# Patient Record
Sex: Female | Born: 1995 | Race: White | Hispanic: No | Marital: Single | Smoking: Never smoker
Health system: Southern US, Community
[De-identification: ages and names within clinical notes are randomized; demographics above are authoritative.]

## PROBLEM LIST (undated history)

## (undated) DIAGNOSIS — L709 Acne, unspecified: Secondary | ICD-10-CM

## (undated) HISTORY — DX: Acne, unspecified: L70.9

---
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
## 2017-12-29 ENCOUNTER — Ambulatory Visit (INDEPENDENT_AMBULATORY_CARE_PROVIDER_SITE_OTHER): Admitting: Obstetrics and Gynecology

## 2017-12-29 ENCOUNTER — Encounter: Payer: Self-pay | Admitting: Obstetrics and Gynecology

## 2017-12-29 VITALS — BP 110/70 | HR 70 | Ht 66.0 in | Wt 148.0 lb

## 2017-12-29 DIAGNOSIS — Z30011 Encounter for initial prescription of contraceptive pills: Secondary | ICD-10-CM | POA: Diagnosis not present

## 2017-12-29 MED ORDER — NORGESTIMATE-ETH ESTRADIOL 0.25-35 MG-MCG PO TABS: 1.0000 | ORAL_TABLET | Freq: Every day | ORAL | 3 refills | Status: DC

## 2017-12-29 MED ORDER — NORGESTIMATE-ETH ESTRADIOL 0.25-35 MG-MCG PO TABS: 1.0000 | ORAL_TABLET | Freq: Every day | ORAL | 0 refills | Status: DC

## 2017-12-29 NOTE — Patient Instructions (Signed)
I value your feedback and entrusting us with your care. If you get a [facility] patient survey, I would appreciate you taking the time to let us know about your experience today. Thank you! 

## 2017-12-29 NOTE — Progress Notes (Addendum)
Arlyss QueenSelvidge, William M, MD   Chief Complaint  Patient presents with  . Contraception  . Gynecologic Exam    HPI:      Ms. Sandra Townsend is a 22 y.o. No obstetric history on file. who LMP was Patient's last menstrual period was 12/20/2017., [facility]. Pt placed on OCPs for acne and back acne 12/17 with Tammy Brothers. Pt's derm MD was managing Rx but now wants us to manage. She was on OTC with acne improvement but started having BTB, so was changed to junel 1/20 12/17. Pt states bleeding improved but acne not controlled. Stopped it about 6 months ago. Wants to restart OCPs. No issues with HTN, DVTs.  Menses are monthly, last 5 days, no BTB, mild dysmen, improved with NSAIDs.   She has never been sex active.  No tob, alcohol, drug use.  No gardasil series.  Not getting enough calcium.  Mod exercise.   Past Medical History:  Diagnosis Date  . Acne     History reviewed. No pertinent surgical history.  Family History  Problem Relation Age of Onset  . Diabetes Father   . Uterine cancer Maternal Grandmother [id number]    Social History   Socioeconomic History  . Marital status: Single    Spouse name: Not on file  . Number of children: Not on file  . Years of education: Not on file  . Highest education level: Not on file  Social Needs  . Financial resource strain: Not on file  . Food insecurity - worry: Not on file  . Food insecurity - inability: Not on file  . Transportation needs - medical: Not on file  . Transportation needs - non-medical: Not on file  Occupational History  . Not on file  Tobacco Use  . Smoking status: Never Smoker  . Smokeless tobacco: Never Used  Substance and Sexual Activity  . Alcohol use: No    Frequency: Never  . Drug use: No  . Sexual activity: No    Birth control/protection: None  Other Topics Concern  . Not on file  Social History Narrative  . Not on file    No current outpatient medications on file prior to  visit.   No current facility-administered medications on file prior to visit.      ROS:  Review of Systems  Constitutional: Negative for fatigue, fever and unexpected weight change.  Respiratory: Negative for cough, shortness of breath and wheezing.   Cardiovascular: Negative for chest pain, palpitations and leg swelling.  Gastrointestinal: Negative for blood in stool, constipation, diarrhea, nausea and vomiting.  Endocrine: Negative for cold intolerance, heat intolerance and polyuria.  Genitourinary: Negative for dyspareunia, dysuria, flank pain, frequency, genital sores, hematuria, menstrual problem, pelvic pain, urgency, vaginal bleeding, vaginal discharge and vaginal pain.  Musculoskeletal: Negative for back pain, joint swelling and myalgias.  Skin: Negative for rash.  Neurological: Negative for dizziness, syncope, light-headedness, numbness and headaches.  Hematological: Negative for adenopathy.  Psychiatric/Behavioral: Negative for agitation, confusion, sleep disturbance and suicidal ideas. The patient is not nervous/anxious.    BREAST: No symptoms   OBJECTIVE:   Vitals:  BP 110/70   Pulse 70   Ht 5\' 6"  (1.676 m)   Wt 148 lb (67.1 kg)   LMP 12/20/2017   BMI 23.89 kg/m   Physical Exam  Constitutional: She is oriented to person, place, and time and well-developed, well-nourished, and in no distress.  Neck: Normal range of motion. No thyromegaly present.  Cardiovascular: Normal rate and regular rhythm.  Pulmonary/Chest: Effort normal and breath sounds normal.  Abdominal: Soft. There is no tenderness.  Musculoskeletal: Normal range of motion.  Neurological: She is alert and oriented to person, place, and time.  Psychiatric: Memory, affect and judgment normal.  Vitals reviewed. GYN EXAM DEFERRED SINE NEVER SEX ACTIVE  Assessment/Plan: Encounter for initial prescription of contraceptive pills - OCP start with next menses. Rx to mail order and 1 locally to start soon.   - Plan: norgestimate-ethinyl estradiol (ORTHO-CYCLEN,SPRINTEC,PREVIFEM) 0.25-35 MG-MCG tablet, norgestimate-ethinyl estradiol (ORTHO-CYCLEN,SPRINTEC,PREVIFEM) 0.25-35 MG-MCG tablet   Meds ordered this encounter  Medications  . norgestimate-ethinyl estradiol (ORTHO-CYCLEN,SPRINTEC,PREVIFEM) 0.25-35 MG-MCG tablet    Sig: Take 1 tablet by mouth daily.    Dispense:  84 tablet    Refill:  3    Order Specific Question:   Supervising Provider    Answer:   Nadara Mustard [id number]  . norgestimate-ethinyl estradiol (ORTHO-CYCLEN,SPRINTEC,PREVIFEM) 0.25-35 MG-MCG tablet    Sig: Take 1 tablet by mouth daily.    Dispense:  28 tablet    Refill:  0    Order Specific Question:   Supervising Provider    Answer:   Nadara Mustard [[id number]]   Gardasil discussed and handout given.    Return in about 1 year (around 12/30/2018).  Alicia B. Copland, PA-C 12/29/2017 2:51 PM

## 2018-05-03 ENCOUNTER — Encounter: Payer: Self-pay | Admitting: Obstetrics and Gynecology

## 2018-12-28 ENCOUNTER — Encounter: Payer: Self-pay | Admitting: Obstetrics and Gynecology

## 2018-12-28 ENCOUNTER — Ambulatory Visit (INDEPENDENT_AMBULATORY_CARE_PROVIDER_SITE_OTHER): Admitting: Obstetrics and Gynecology

## 2018-12-28 ENCOUNTER — Other Ambulatory Visit (HOSPITAL_COMMUNITY)
Admission: RE | Admit: 2018-12-28 | Discharge: 2018-12-28 | Disposition: A | Discharge status: Home / Self Care | Payer: Self-pay | Source: Ambulatory Visit | Attending: Obstetrics and Gynecology | Admitting: Obstetrics and Gynecology

## 2018-12-28 VITALS — BP 110/64 | HR 80 | Ht 67.0 in | Wt 164.0 lb

## 2018-12-28 DIAGNOSIS — Z01419 Encounter for gynecological examination (general) (routine) without abnormal findings: Secondary | ICD-10-CM | POA: Diagnosis not present

## 2018-12-28 DIAGNOSIS — Z124 Encounter for screening for malignant neoplasm of cervix: Secondary | ICD-10-CM

## 2018-12-28 DIAGNOSIS — R42 Dizziness and giddiness: Secondary | ICD-10-CM | POA: Diagnosis not present

## 2018-12-28 LAB — POCT HEMOGLOBIN: HEMOGLOBIN: 14.9 g/dL — AB (ref 11–14.6)

## 2018-12-28 NOTE — Progress Notes (Signed)
PCP:  Arlyss Queen, MD   Chief Complaint  Patient presents with  . Gynecologic Exam     HPI:      Ms. Sandra Townsend is a 23 y.o. G0P0000 who LMP was Patient's last menstrual period was 12/20/2018 (exact date)., presents today for her annual examination.  Her menses are regular every 28-30 days, lasting 5 days.  Dysmenorrhea mild, occurring first 1-2 days of flow. She does not have intermenstrual bleeding. Was on OCP for acne but stopped them last yr due to mood changes. Back acne is most bothersome but pt ok for now. Pt notes dizziness with menses, and sometimes other times. Usually if gets up fast. Concerned about anemia.  Sex activity: never sexually active.  Last TZG:YFVCB Hx of STDs: none  There is no FH of breast cancer. There is no FH of ovarian cancer. The patient does not do self-breast exams.  Tobacco use: The patient denies current or previous tobacco use. Alcohol use: none No drug use.  Exercise: moderately active  She does get adequate calcium and Vitamin D in her diet.  Gardasil not done.   Past Medical History:  Diagnosis Date  . Acne     History reviewed. No pertinent surgical history.  Family History  Problem Relation Age of Onset  . Diabetes Father   . Uterine cancer Maternal Grandmother 51    Social History   Socioeconomic History  . Marital status: Single    Spouse name: Not on file  . Number of children: Not on file  . Years of education: Not on file  . Highest education level: Not on file  Occupational History  . Not on file  Social Needs  . Financial resource strain: Not on file  . Food insecurity:    Worry: Not on file    Inability: Not on file  . Transportation needs:    Medical: Not on file    Non-medical: Not on file  Tobacco Use  . Smoking status: Never Smoker  . Smokeless tobacco: Never Used  Substance and Sexual Activity  . Alcohol use: No    Frequency: Never  . Drug use: No  . Sexual activity: Never    Birth  control/protection: None  Lifestyle  . Physical activity:    Days per week: Not on file    Minutes per session: Not on file  . Stress: Not on file  Relationships  . Social connections:    Talks on phone: Not on file    Gets together: Not on file    Attends religious service: Not on file    Active member of club or organization: Not on file    Attends meetings of clubs or organizations: Not on file    Relationship status: Not on file  . Intimate partner violence:    Fear of current or ex partner: Not on file    Emotionally abused: Not on file    Physically abused: Not on file    Forced sexual activity: Not on file  Other Topics Concern  . Not on file  Social History Narrative  . Not on file    Outpatient Medications Prior to Visit  Medication Sig Dispense Refill  . norgestimate-ethinyl estradiol (ORTHO-CYCLEN,SPRINTEC,PREVIFEM) 0.25-35 MG-MCG tablet Take 1 tablet by mouth daily. (Patient not taking: Reported on 12/28/2018) 84 tablet 3  . norgestimate-ethinyl estradiol (ORTHO-CYCLEN,SPRINTEC,PREVIFEM) 0.25-35 MG-MCG tablet Take 1 tablet by mouth daily. 28 tablet 0   No facility-administered medications prior to visit.  ROS:  Review of Systems  Constitutional: Negative for fatigue, fever and unexpected weight change.  Respiratory: Negative for cough, shortness of breath and wheezing.   Cardiovascular: Negative for chest pain, palpitations and leg swelling.  Gastrointestinal: Negative for blood in stool, constipation, diarrhea, nausea and vomiting.  Endocrine: Negative for cold intolerance, heat intolerance and polyuria.  Genitourinary: Negative for dyspareunia, dysuria, flank pain, frequency, genital sores, hematuria, menstrual problem, pelvic pain, urgency, vaginal bleeding, vaginal discharge and vaginal pain.  Musculoskeletal: Negative for back pain, joint swelling and myalgias.  Skin: Negative for rash.  Neurological: Positive for dizziness and headaches. Negative  for syncope, light-headedness and numbness.  Hematological: Negative for adenopathy.  Psychiatric/Behavioral: Negative for agitation, confusion, sleep disturbance and suicidal ideas. The patient is not nervous/anxious.    BREAST: No symptoms   Objective: BP 110/64   Pulse 80   Ht 5\' 7"  (1.702 m)   Wt 164 lb (74.4 kg)   LMP 12/20/2018 (Exact Date)   BMI 25.69 kg/m    Physical Exam Constitutional:      Appearance: She is well-developed.  Genitourinary:     Vulva, vagina, cervix, uterus, right adnexa and left adnexa normal.     No vulval lesion or tenderness noted.     No vaginal discharge, erythema or tenderness.     No cervical polyp.     Uterus is not enlarged or tender.     No right or left adnexal mass present.     Right adnexa not tender.     Left adnexa not tender.  Neck:     Musculoskeletal: Normal range of motion.     Thyroid: No thyromegaly.  Cardiovascular:     Rate and Rhythm: Normal rate and regular rhythm.     Heart sounds: Normal heart sounds. No murmur.  Pulmonary:     Effort: Pulmonary effort is normal.     Breath sounds: Normal breath sounds.  Chest:     Breasts:        Right: No mass, nipple discharge, skin change or tenderness.        Left: No mass, nipple discharge, skin change or tenderness.  Abdominal:     Palpations: Abdomen is soft.     Tenderness: There is no abdominal tenderness. There is no guarding.  Musculoskeletal: Normal range of motion.  Neurological:     General: No focal deficit present.     Mental Status: She is alert and oriented to person, place, and time.     Cranial Nerves: No cranial nerve deficit.  Skin:    General: Skin is warm and dry.  Psychiatric:        Mood and Affect: Mood normal.        Behavior: Behavior normal.        Thought Content: Thought content normal.        Judgment: Judgment normal.  Vitals signs reviewed.     Results: Results for orders placed or performed in visit on 12/28/18 (from the past 24  hour(s))  POCT hemoglobin     Status: Abnormal   Collection Time: 12/28/18  3:31 PM  Result Value Ref Range   Hemoglobin 14.9 (A) 11 - 14.6 g/dL    Assessment/Plan: Encounter for annual routine gynecological examination  Cervical cancer screening - Plan: Cytology - PAP  Dizziness - With menses. HgB WNL. Reassurance. Can be hormonal. Make sure to eat/drink normally. Most likely postural given sx hx. - Plan: POCT hemoglobin  GYN counsel adequate intake of calcium and vitamin D, diet and exercise     F/U  Return in about 1 year (around 12/28/2019).  Alicia B. Copland, PA-C 12/28/2018 3:33 PM

## 2018-12-28 NOTE — Patient Instructions (Signed)
I value your feedback and entrusting us with your care. If you get a [facility] patient survey, I would appreciate you taking the time to let us know about your experience today. Thank you! 

## 2018-12-30 LAB — CYTOLOGY - PAP: Diagnosis: NEGATIVE

## 2019-01-04 ENCOUNTER — Ambulatory Visit: Admitting: Obstetrics and Gynecology

## 2019-05-31 ENCOUNTER — Other Ambulatory Visit: Payer: Self-pay

## 2019-05-31 ENCOUNTER — Emergency Department

## 2019-05-31 ENCOUNTER — Emergency Department
Admission: EM | Admit: 2019-05-31 | Discharge: 2019-05-31 | Disposition: A | Discharge status: Home / Self Care | Attending: Emergency Medicine | Admitting: Emergency Medicine

## 2019-05-31 ENCOUNTER — Encounter: Payer: Self-pay | Admitting: Emergency Medicine

## 2019-05-31 DIAGNOSIS — R519 Headache, unspecified: Secondary | ICD-10-CM

## 2019-05-31 DIAGNOSIS — R51 Headache: Secondary | ICD-10-CM | POA: Diagnosis not present

## 2019-05-31 DIAGNOSIS — R42 Dizziness and giddiness: Secondary | ICD-10-CM | POA: Diagnosis not present

## 2019-05-31 LAB — BASIC METABOLIC PANEL
Anion gap: 6 (ref 5–15)
BUN: 14 mg/dL (ref 6–20)
CO2: 25 mmol/L (ref 22–32)
Calcium: 9.2 mg/dL (ref 8.9–10.3)
Chloride: 106 mmol/L (ref 98–111)
Creatinine, Ser: 1.09 mg/dL — ABNORMAL HIGH (ref 0.44–1.00)
GFR calc Af Amer: >60 mL/min (ref >60)
GFR calc non Af Amer: >60 mL/min (ref >60)
Glucose, Bld: 91 mg/dL (ref 70–99)
Potassium: 3.8 mmol/L (ref 3.5–5.1)
Sodium: 137 mmol/L (ref 135–145)

## 2019-05-31 LAB — URINALYSIS, COMPLETE (UACMP) WITH MICROSCOPIC
Bilirubin Urine: NEGATIVE
Glucose, UA: NEGATIVE mg/dL
Hgb urine dipstick: NEGATIVE
Ketones, ur: NEGATIVE mg/dL
Leukocytes,Ua: NEGATIVE
Nitrite: NEGATIVE
Protein, ur: NEGATIVE mg/dL
Specific Gravity, Urine: 1.024 (ref 1.005–1.030)
pH: 7 (ref 5.0–8.0)

## 2019-05-31 LAB — CBC
HCT: 42.4 % (ref 36.0–46.0)
Hemoglobin: 14.1 g/dL (ref 12.0–15.0)
MCH: 28.8 pg (ref 26.0–34.0)
MCHC: 33.3 g/dL (ref 30.0–36.0)
MCV: 86.5 fL (ref 80.0–100.0)
Platelets: 245 10*3/uL (ref 150–400)
RBC: 4.9 MIL/uL (ref 3.87–5.11)
RDW: 12.3 % (ref 11.5–15.5)
WBC: 7.3 10*3/uL (ref 4.0–10.5)
nRBC: 0 % (ref 0.0–0.2)

## 2019-05-31 LAB — POCT PREGNANCY, URINE: Preg Test, Ur: NEGATIVE

## 2019-05-31 MED ORDER — SODIUM CHLORIDE 0.9 % IV BOLUS
1000.0000 mL | Freq: Once | INTRAVENOUS | Status: AC
  Administered 2019-05-31: 1000 mL via INTRAVENOUS

## 2019-05-31 MED ORDER — SODIUM CHLORIDE 0.9 % IV BOLUS
1000.0000 mL | Freq: Once | INTRAVENOUS | Status: AC
  Administered 2019-05-31: 18:00:00 1000 mL via INTRAVENOUS

## 2019-05-31 MED ORDER — MECLIZINE HCL 25 MG PO TABS
25.0000 mg | ORAL_TABLET | Freq: Once | ORAL | Status: AC
  Administered 2019-05-31: 25 mg via ORAL
  Filled 2019-05-31: qty 1

## 2019-05-31 MED ORDER — DIPHENHYDRAMINE HCL 50 MG/ML IJ SOLN
25.0000 mg | Freq: Once | INTRAMUSCULAR | Status: AC
  Administered 2019-05-31: 25 mg via INTRAVENOUS
  Filled 2019-05-31: qty 1

## 2019-05-31 MED ORDER — KETOROLAC TROMETHAMINE 30 MG/ML IJ SOLN
30.0000 mg | Freq: Once | INTRAMUSCULAR | Status: AC
  Administered 2019-05-31: 30 mg via INTRAVENOUS
  Filled 2019-05-31: qty 1

## 2019-05-31 NOTE — ED Triage Notes (Signed)
PT to ER states that she has been having dizzy spells and headache for last 2 weeks.  Pt states she was seen by [facility] and given meds for sinusitis.  Pt states yesterday that her symptoms got worse and she called [facility] who advised her to be seen here.

## 2019-05-31 NOTE — ED Notes (Signed)
See triage note  Presents with h/a and dizziness  States h/a's started at temporal areas and moves  Was placed on sinus meds w/o relief  This am states the room was spinning

## 2019-05-31 NOTE — ED Provider Notes (Signed)
[facility] Emergency Department Provider Note  ____________________________________________  Time seen: Approximately 8:53 PM  I have reviewed the triage vital signs and the nursing notes.   HISTORY  Chief Complaint Dizziness, Headache, and Palpitations    HPI Sandra Townsend is a 23 y.o. female presents to the emergency department with headache and dizziness that is occurred intermittently over the past 2 weeks.  Patient states that she typically has dizziness from supine to sitting position.  Patient was seen and evaluated by urgent care and was treated with antibiotic for sinusitis and was also given meclizine.  Patient states that she has not tried the meclizine but took antibiotics as directed.  Patient localizes pain over her forehead and states that sometimes it radiates into her cheeks.  She denies photophobia or phonophobia.  She denies a history of migraines.  Denies possibility of pregnancy.  Denies fever at home.  She has not had any rhinorrhea, nasal congestion or nonproductive cough.  Denies diarrhea or emesis.  Denies falls or mechanisms of trauma.  Patient states that headache temporarily resolves with anti-inflammatories but then returns.  No other alleviating measures have been attempted.        Past Medical History:  Diagnosis Date  . Acne     There are no active problems to display for this patient.   History reviewed. No pertinent surgical history.  Prior to Admission medications   Not on File    Allergies Patient has no known allergies.  Family History  Problem Relation Age of Onset  . Diabetes Father   . Uterine cancer Maternal Grandmother [id number]    Social History Social History   Tobacco Use  . Smoking status: Never Smoker  . Smokeless tobacco: Never Used  Substance Use Topics  . Alcohol use: No    Frequency: Never  . Drug use: No     Review of Systems  Constitutional: Patient has vertigo.  Eyes: No visual  changes. No discharge ENT: No upper respiratory complaints. Cardiovascular: no chest pain. Respiratory: no cough. No SOB. Gastrointestinal: No abdominal pain.  No nausea, no vomiting.  No diarrhea.  No constipation. Genitourinary: Negative for dysuria. No hematuria Musculoskeletal: Negative for musculoskeletal pain. Skin: Negative for rash, abrasions, lacerations, ecchymosis. Neurological: Patient has headache, no focal weakness or numbness.   ____________________________________________   PHYSICAL EXAM:  VITAL SIGNS: ED Triage Vitals  Enc Vitals Group     BP 05/31/19 1555 140/70     Pulse Rate 05/31/19 1555 (!) 103     Resp 05/31/19 1555 18     Temp 05/31/19 1555 98.5 F (36.9 C)     Temp Source 05/31/19 1555 Oral     SpO2 05/31/19 1555 96 %     Weight 05/31/19 1556 165 lb (74.8 kg)     Height 05/31/19 1556 5\' 7"  (1.702 m)     Head Circumference --      Peak Flow --      Pain Score 05/31/19 1555 6     Pain Loc --      Pain Edu? --      Excl. in GC? --      Constitutional: Alert and oriented. Well appearing and in no acute distress. Eyes: Conjunctivae are normal. PERRL. EOMI. Head: Atraumatic. ENT:      Ears: Right TM is injected. Left TM is pearly.       Nose: No congestion/rhinnorhea.      Mouth/Throat: Mucous membranes are moist.  Neck: No  stridor.  No cervical spine tenderness to palpation. Cardiovascular: Normal rate, regular rhythm. Normal S1 and S2.  Good peripheral circulation. Respiratory: Normal respiratory effort without tachypnea or retractions. Lungs CTAB. Good air entry to the bases with no decreased or absent breath sounds. Gastrointestinal: Bowel sounds 4 quadrants. Soft and nontender to palpation. No guarding or rigidity. No palpable masses. No distention. No CVA tenderness. Musculoskeletal: Full range of motion to all extremities. No gross deformities appreciated. Neurologic:  Cranial nerves 2-12 intact. Normal speech and language. No gross focal  neurologic deficits are appreciated.  Negative Romberg.  Patient can perform rapid alternating movements.  No hypo-or hyperreflexia. Skin:  Skin is warm, dry and intact. No rash noted. Psychiatric: Mood and affect are normal. Speech and behavior are normal. Patient exhibits appropriate insight and judgement.   ____________________________________________   LABS (all labs ordered are listed, but only abnormal results are displayed)  Labs Reviewed  BASIC METABOLIC PANEL - Abnormal; Notable for the following components:      Result Value   Creatinine, Ser 1.09 (*)    All other components within normal limits  URINALYSIS, COMPLETE (UACMP) WITH MICROSCOPIC - Abnormal; Notable for the following components:   Color, Urine YELLOW (*)    APPearance CLEAR (*)    Bacteria, UA RARE (*)    All other components within normal limits  CBC  POC URINE PREG, ED  POCT PREGNANCY, URINE   ____________________________________________  EKG   ____________________________________________  RADIOLOGY I personally viewed and evaluated these images as part of my medical decision making, as well as reviewing the written report by the radiologist.  Ct Head Wo Contrast  Result Date: 05/31/2019 CLINICAL DATA:  Cerebral hemorrhage suspected, dizziness and headache for 2 weeks, symptoms acutely worsening EXAM: CT HEAD WITHOUT CONTRAST TECHNIQUE: Contiguous axial images were obtained from the base of the skull through the vertex without intravenous contrast. COMPARISON:  Brain MRI 08/14/2011 FINDINGS: Brain: No evidence of acute infarction, hemorrhage, hydrocephalus, extra-axial collection or mass lesion/mass effect. Vascular: No hyperdense vessel or unexpected calcification. Skull: No calvarial fracture or suspicious osseous lesion. No scalp swelling or hematoma. Sinuses/Orbits: Paranasal sinuses and mastoid air cells are predominantly clear. Orbital structures are unremarkable. Other: None. IMPRESSION: Normal  noncontrast head CT. Electronically Signed   By: Kreg ShropshirePrice  DeHay M.D.   On: 05/31/2019 18:17    ____________________________________________    PROCEDURES  Procedure(s) performed:    Procedures    Medications  sodium chloride 0.9 % bolus 1,000 mL (0 mLs Intravenous Stopped 05/31/19 1909)  ketorolac (TORADOL) 30 MG/ML injection 30 mg (30 mg Intravenous Given 05/31/19 1906)  diphenhydrAMINE (BENADRYL) injection 25 mg (25 mg Intravenous Given 05/31/19 1906)  meclizine (ANTIVERT) tablet 25 mg (25 mg Oral Given 05/31/19 1916)  sodium chloride 0.9 % bolus 1,000 mL (0 mLs Intravenous Stopped 05/31/19 2023)     ____________________________________________   INITIAL IMPRESSION / ASSESSMENT AND PLAN / ED COURSE  Pertinent labs & imaging results that were available during my care of the patient were reviewed by me and considered in my medical decision making (see chart for details).  Review of the [place] CSRS was performed in accordance of the [facility] prior to dispensing any controlled drugs.        Assessment and plan Headache Dizziness 23 year old female presents to the emergency department with headache and dizziness that have occurred over the past 2 weeks.  Patient had reassuring vital signs at triage.  She was alert and talkative.  Patient had a reassuring  neuro exam that was appropriate for age.  Differential diagnosis included subdural hematoma, subarachnoid hemorrhage, pregnancy, benign positional vertigo, headache  Basic labs were obtained in the emergency department.  Creatinine was mildly elevated.  Urine pregnancy testing was negative.  CT head revealed no evidence of intracranial bleed.  Patient was given supplemental fluids in the emergency department, Benadryl and Toradol.  She reported that her headache resolved.  Patient was advised to continue using meclizine at home as directed by urgent care provider.  She was advised to follow-up with primary care if her headaches persist.   All patient questions were answered.  ____________________________________________  FINAL CLINICAL IMPRESSION(S) / ED DIAGNOSES  Final diagnoses:  Acute nonintractable headache, unspecified headache type  Dizziness      NEW MEDICATIONS STARTED DURING THIS VISIT:  ED Discharge Orders    None          This chart was dictated using voice recognition software/Dragon. Despite best efforts to proofread, errors can occur which can change the meaning. Any change was purely unintentional.    Karren Cobble 05/31/19 2101    Nance Pear, MD 05/31/19 2156

## 2019-05-31 NOTE — ED Notes (Signed)
Sent green and purple top tubes to the lab.

## 2020-12-28 IMAGING — CT CT HEAD WITHOUT CONTRAST
3 series · 15 of 47 positions shown, 18 images · non-contrast
Comparison: Brain MRI 08/14/2011

CLINICAL DATA: Cerebral hemorrhage suspected, dizziness and
headache for 2 weeks, symptoms acutely worsening

EXAM:
CT HEAD WITHOUT CONTRAST
TECHNIQUE: Contiguous axial images were obtained from the base of the skull
through the vertex without intravenous contrast.

[Series 2: head wo · axial · 0.47mm/px · z∈[-157,-32]mm · 9 of 30 slices shown, 12 images]
[im 3/30  brain]
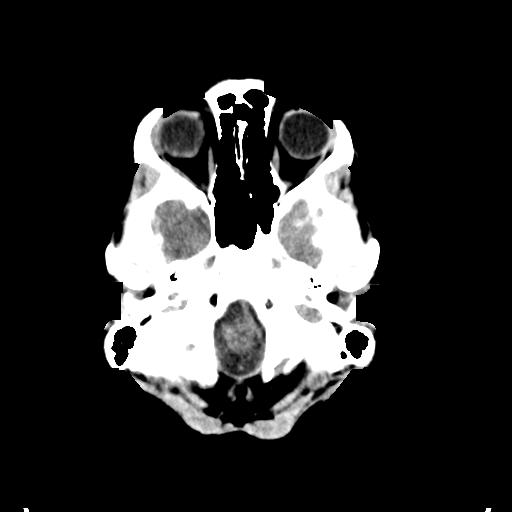
[im 3/30  bone]
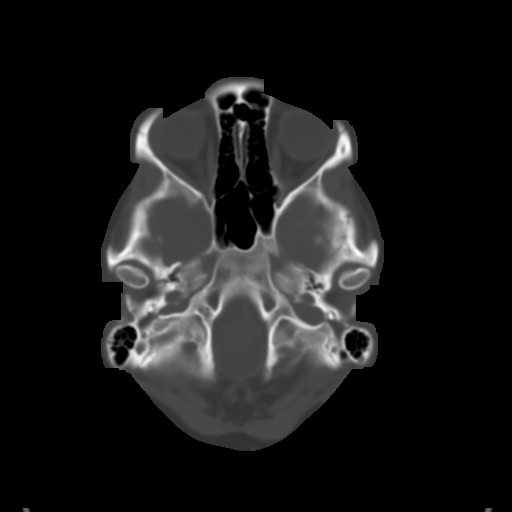
[im 6/30  brain]
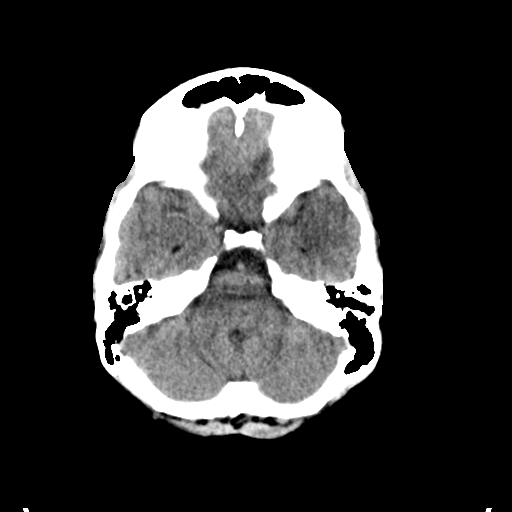
[im 9/30  brain]
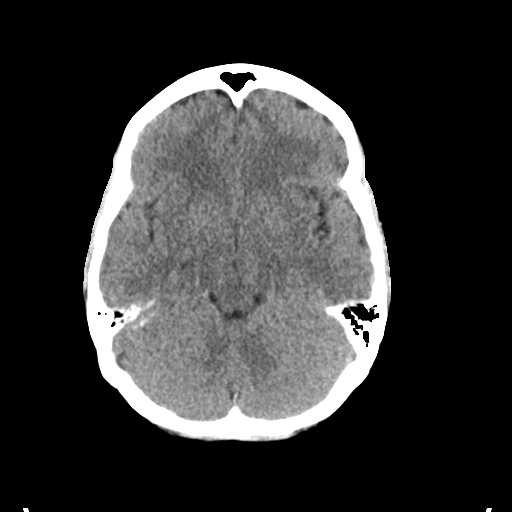
[im 12/30  brain]
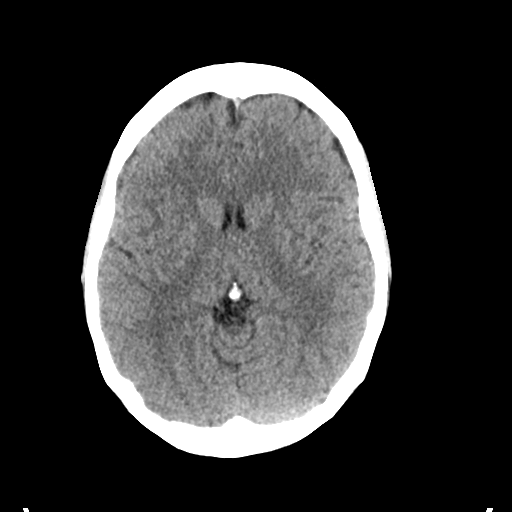
[im 16/30  brain]
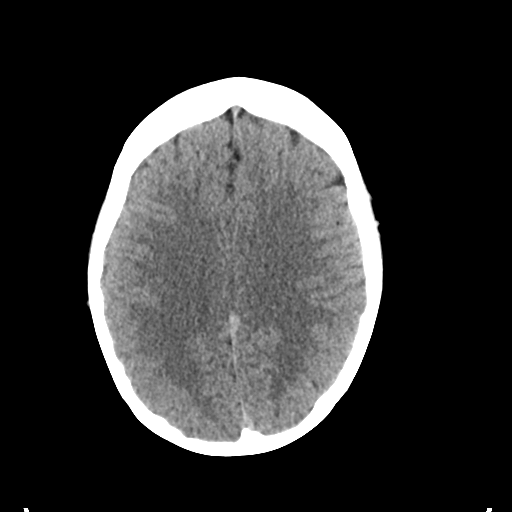
[im 16/30  bone]
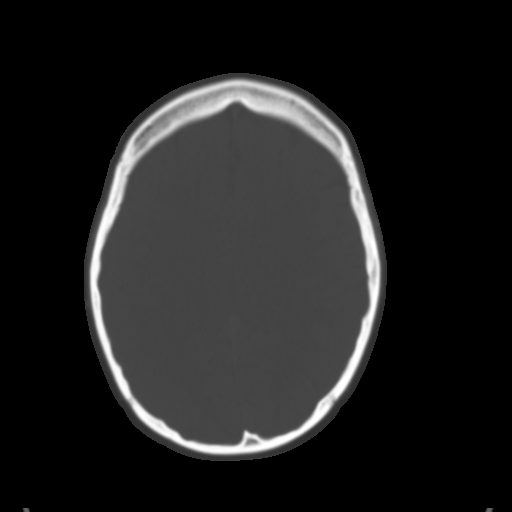
[im 19/30  brain]
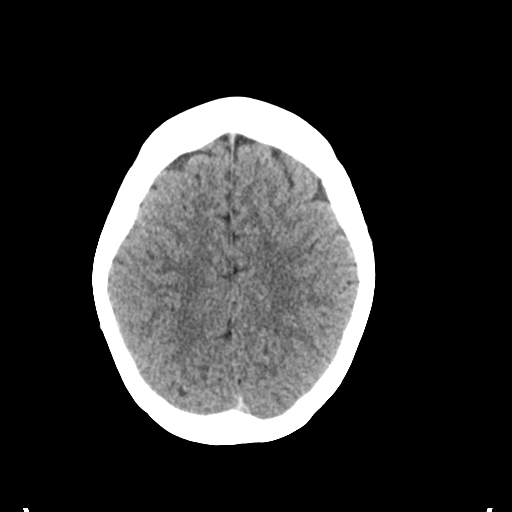
[im 22/30  brain]
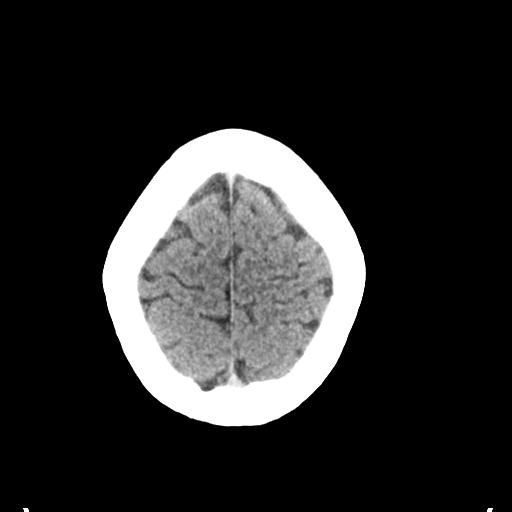
[im 25/30  brain]
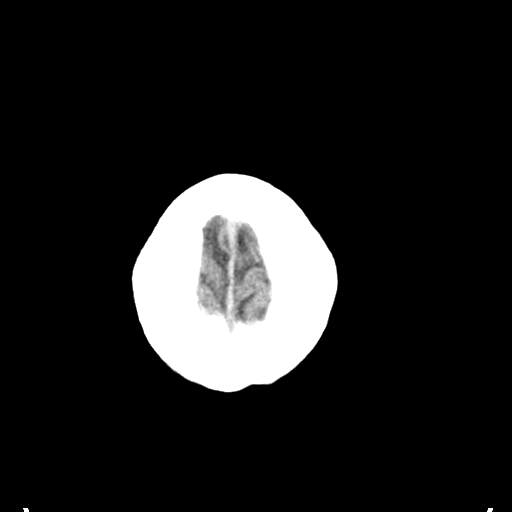
[im 28/30  brain]
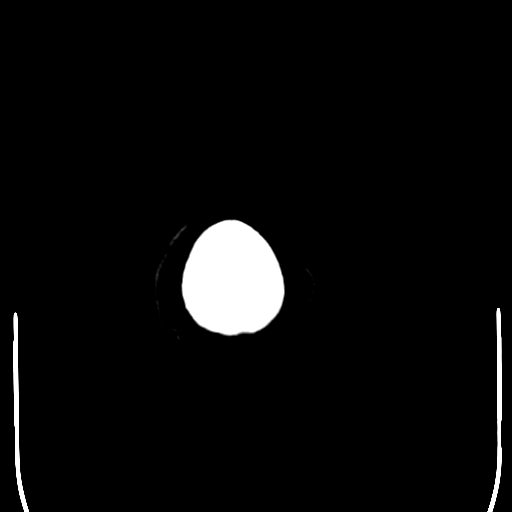
[im 28/30  bone]
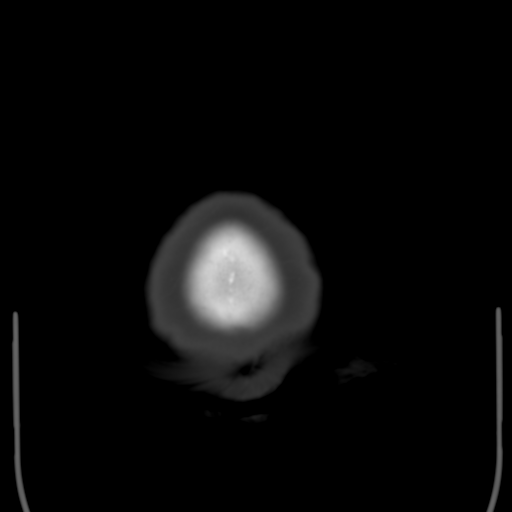

[Series 4: coronal soft tissue · coronal · 0.29mm/px · 3 of 66 slices shown]
[im 22/66  brain]
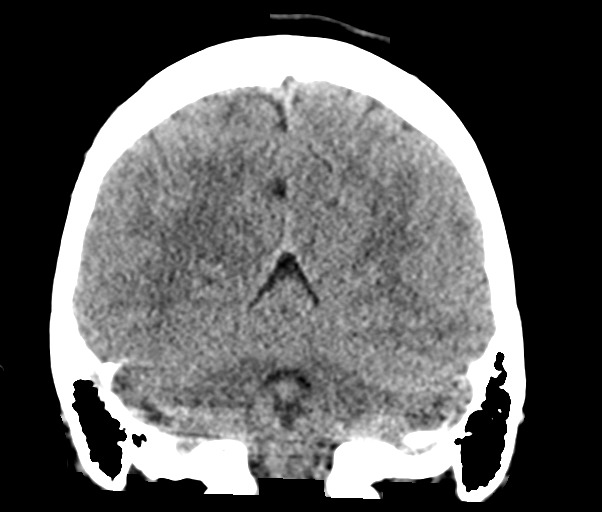
[im 29/66  brain]
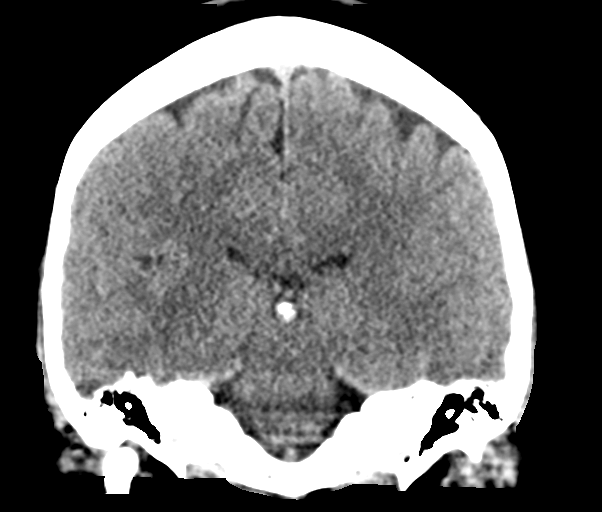
[im 37/66  brain]
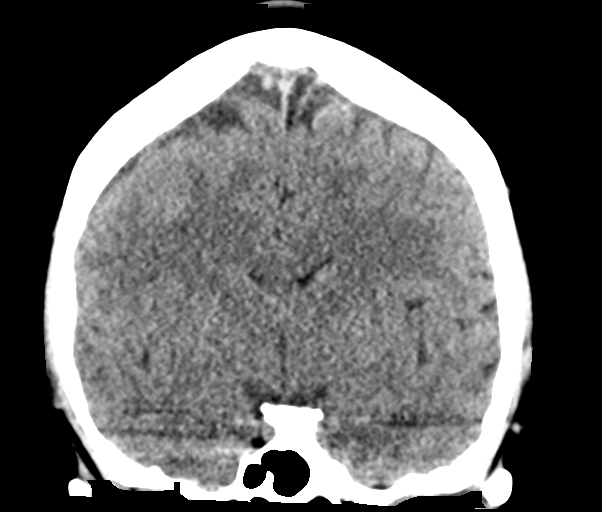

[Series 5: sagittal soft tissue · sagittal · 0.29mm/px · 3 of 57 slices shown]
[im 19/57  brain]
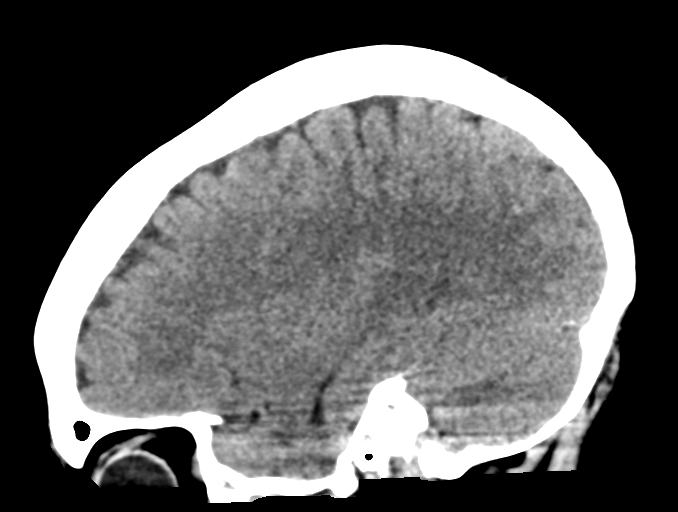
[im 29/57  brain]
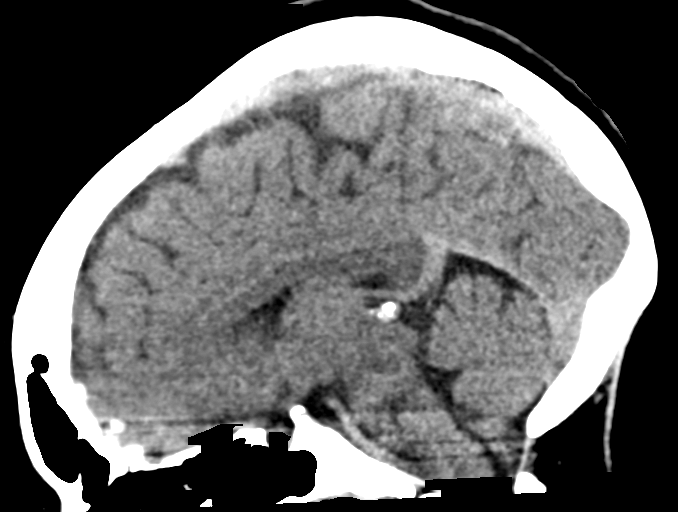
[im 38/57  brain]
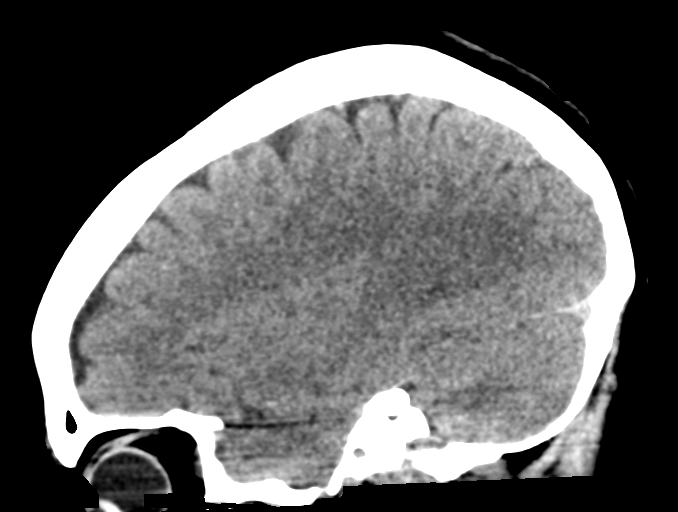

[15 of 47 positions shown; findings below may reference images not displayed]

FINDINGS: Brain: No evidence of acute infarction, hemorrhage, hydrocephalus,
extra-axial collection or mass lesion/mass effect.

Vascular: No hyperdense vessel or unexpected calcification.

Skull: No calvarial fracture or suspicious osseous lesion. No scalp
swelling or hematoma.

Sinuses/Orbits: Paranasal sinuses and mastoid air cells are
predominantly clear. Orbital structures are unremarkable.

Other: None.
IMPRESSION: Normal noncontrast head CT.
